# Patient Record
Sex: Female | Born: 1965 | Race: White | Hispanic: No | Marital: Single | Smoking: Former smoker
Health system: Southern US, Community
[De-identification: ages and names within clinical notes are randomized; demographics above are authoritative.]

## PROBLEM LIST (undated history)

## (undated) DIAGNOSIS — F329 Major depressive disorder, single episode, unspecified: Secondary | ICD-10-CM

## (undated) DIAGNOSIS — Z85810 Personal history of malignant neoplasm of tongue: Secondary | ICD-10-CM

## (undated) DIAGNOSIS — F419 Anxiety disorder, unspecified: Secondary | ICD-10-CM

## (undated) DIAGNOSIS — F32A Depression, unspecified: Secondary | ICD-10-CM

## (undated) HISTORY — DX: Major depressive disorder, single episode, unspecified: F32.9

## (undated) HISTORY — PX: NECK DISSECTION: SUR422

## (undated) HISTORY — DX: Anxiety disorder, unspecified: F41.9

## (undated) HISTORY — DX: Personal history of malignant neoplasm of tongue: Z85.810

## (undated) HISTORY — DX: Depression, unspecified: F32.A

## (undated) HISTORY — PX: PARTIAL GLOSSECTOMY: SHX2173

---

## 1998-05-31 ENCOUNTER — Other Ambulatory Visit: Admission: RE | Admit: 1998-05-31 | Discharge: 1998-05-31 | Payer: Self-pay | Admitting: Gynecology

## 1999-05-31 ENCOUNTER — Other Ambulatory Visit: Admission: RE | Admit: 1999-05-31 | Discharge: 1999-05-31 | Payer: Self-pay | Admitting: Gynecology

## 2000-07-22 ENCOUNTER — Other Ambulatory Visit: Admission: RE | Admit: 2000-07-22 | Discharge: 2000-07-22 | Payer: Self-pay | Admitting: Gynecology

## 2001-07-22 ENCOUNTER — Other Ambulatory Visit: Admission: RE | Admit: 2001-07-22 | Discharge: 2001-07-22 | Payer: Self-pay | Admitting: Gynecology

## 2015-05-27 ENCOUNTER — Encounter (HOSPITAL_COMMUNITY): Payer: Self-pay | Admitting: Emergency Medicine

## 2015-05-27 ENCOUNTER — Ambulatory Visit (HOSPITAL_COMMUNITY)
Admission: EM | Admit: 2015-05-27 | Discharge: 2015-05-27 | Disposition: A | Discharge status: Home / Self Care | Payer: 59 | Attending: Family Medicine | Admitting: Family Medicine

## 2015-05-27 DIAGNOSIS — IMO0001 Reserved for inherently not codable concepts without codable children: Secondary | ICD-10-CM

## 2015-05-27 DIAGNOSIS — K140 Glossitis: Secondary | ICD-10-CM | POA: Diagnosis not present

## 2015-05-27 DIAGNOSIS — R030 Elevated blood-pressure reading, without diagnosis of hypertension: Secondary | ICD-10-CM

## 2015-05-27 DIAGNOSIS — R000 Tachycardia, unspecified: Secondary | ICD-10-CM

## 2015-05-27 MED ORDER — CLINDAMYCIN HCL 300 MG PO CAPS: 300.0000 mg | ORAL_CAPSULE | Freq: Four times a day (QID) | ORAL | Status: DC

## 2015-05-27 MED ORDER — PREDNISONE 20 MG PO TABS: 20.0000 mg | ORAL_TABLET | Freq: Every day | ORAL | Status: DC

## 2015-05-27 NOTE — ED Provider Notes (Signed)
CSN: [id number]     Arrival date & time 05/27/15  1851 History   First MD Initiated Contact with Patient 05/27/15 1957     Chief Complaint  Patient presents with  . Oral Swelling   (Consider location/radiation/quality/duration/timing/severity/associated sxs/prior Treatment) The history is provided by the patient. No language interpreter was used.  Tongue swelling: C/O swelling of her tongue on the left side which started few weeks ago gradually but worsening two to three days,she has diffuculty with eating and swallowing pills. 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 Denies SOB, no choking. Just some discomfort and pain over her tongue. Denies medication or food allergy. She has hx of tobacco smoking years ago. Elevated BP/HR: No BP or cardiac related complaints.  History reviewed. No pertinent past medical history. History reviewed. No pertinent past surgical history. History reviewed. No pertinent family history. Social History  Substance Use Topics  . Smoking status: Never Smoker   . Smokeless tobacco: None  . Alcohol Use: No   OB History    No data available     Review of Systems  HENT:       Tongue pain and swelling  Respiratory: Negative.   Cardiovascular: Negative.   All other systems reviewed and are negative.   Allergies  Review of patient's allergies indicates no known allergies.  Home Medications   Prior to Admission medications   Not on File   Meds Ordered and Administered this Visit  Medications - No data to display  BP 150/82 mmHg  Pulse 114  Temp(Src) 98.2 F (36.8 C) (Oral)  SpO2 100% No data found.   Physical Exam  Constitutional: She appears well-developed. No distress.  HENT:  Head: Normocephalic.  Mouth/Throat:    Cardiovascular: Normal rate, regular rhythm and normal heart sounds.   No murmur heard. Pulmonary/Chest: Effort normal and breath sounds normal. No respiratory distress.  She has no wheezes. She has no rales. She exhibits no tenderness.  Nursing note and vitals reviewed.     ED Course  Procedures (including critical care time)  Labs Review Labs Reviewed - No data to display  Imaging Review No results found.   Visual Acuity Review  Right Eye Distance:   Left Eye Distance:   Bilateral Distance:    Right Eye Near:   Left Eye Near:    Bilateral Near:         MDM  No diagnosis found. Tongue ulcer  Tongue infection  Tachycardia  Elevated blood pressure  Here tongue looks infected. This could be from biting. A/B prescribed with steroid to help with the swelling. I recommended ENT assessment to r/o cancer given smoking hx. She agreed to see her PCP as soon as possible for referral. In the mean time may use warm saline gurgle at home as well.  Her HR and BP are elevated although she is completely asymptomatic except for mild anxiety about her tongue. This could well contribute to her elevated BP and HR. Instruction given to f/u with her PCP to address issue.    Kinnie Feil, MD 05/27/15 2021

## 2015-05-27 NOTE — ED Notes (Signed)
The patient presented to the [facility] with a complaint of a swollen tongue that has been ongoing for 2 weeks.

## 2015-05-27 NOTE — Discharge Instructions (Signed)
It was nice seeing you today. I am sorry about your tongue pain. It could be from frequent biting. It does look infection. I will give some antibiotic. Also due to history of smoking please see ENT for further evaluation in the next week. Note your Heart Rate and BP are elevated. Please see your PCP as soon as possible for assessment.

## 2015-05-30 ENCOUNTER — Other Ambulatory Visit: Payer: Self-pay | Admitting: Otolaryngology

## 2015-05-30 ENCOUNTER — Ambulatory Visit
Admission: RE | Admit: 2015-05-30 | Discharge: 2015-05-30 | Disposition: A | Discharge status: Home / Self Care | Payer: 59 | Source: Ambulatory Visit | Attending: Otolaryngology | Admitting: Otolaryngology

## 2015-05-30 DIAGNOSIS — R220 Localized swelling, mass and lump, head: Principal | ICD-10-CM

## 2015-05-30 DIAGNOSIS — K148 Other diseases of tongue: Secondary | ICD-10-CM

## 2015-05-30 MED ORDER — IOPAMIDOL (ISOVUE-300) INJECTION 61%
75.0000 mL | Freq: Once | INTRAVENOUS | Status: AC | PRN
  Administered 2015-05-30: 75 mL via INTRAVENOUS

## 2015-06-09 ENCOUNTER — Other Ambulatory Visit (HOSPITAL_COMMUNITY): Payer: Self-pay | Admitting: Otolaryngology

## 2015-06-09 ENCOUNTER — Other Ambulatory Visit: Payer: Self-pay | Admitting: Otolaryngology

## 2015-06-09 DIAGNOSIS — C029 Malignant neoplasm of tongue, unspecified: Secondary | ICD-10-CM

## 2015-06-09 DIAGNOSIS — R590 Localized enlarged lymph nodes: Secondary | ICD-10-CM

## 2015-06-16 ENCOUNTER — Encounter (HOSPITAL_COMMUNITY): Payer: 59

## 2017-11-29 IMAGING — CT CT NECK W/ CM
2 of 3 series · 8 of 14 positions shown, 9 images · IV contrast (iopamidol)
Comparison: None.

CLINICAL DATA: 49-year-old female with tongue mass discovered 3
weeks ago, biopsy planned. Initial encounter.

EXAM:
CT NECK WITH CONTRAST
TECHNIQUE: Multidetector CT imaging of the neck was performed using the
standard protocol following the bolus administration of intravenous
contrast.
CONTRAST:  75mL W5XG1E-LPP IOPAMIDOL (W5XG1E-LPP) INJECTION 61%

[Series 2: neck · axial · 0.38mm/px · z∈[-293,-122]mm · 4 of 97 slices shown]
[im 20/97  bone]
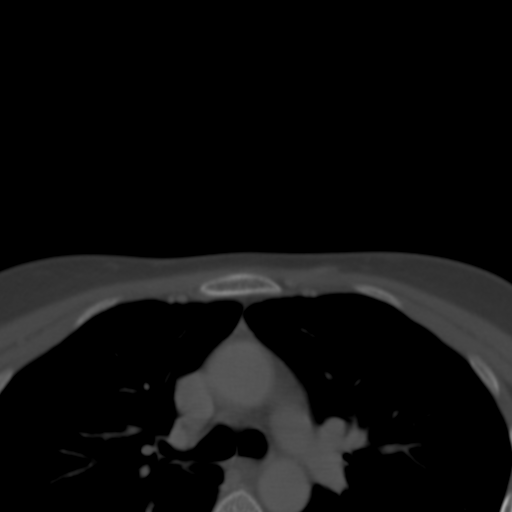
[im 39/97  bone]
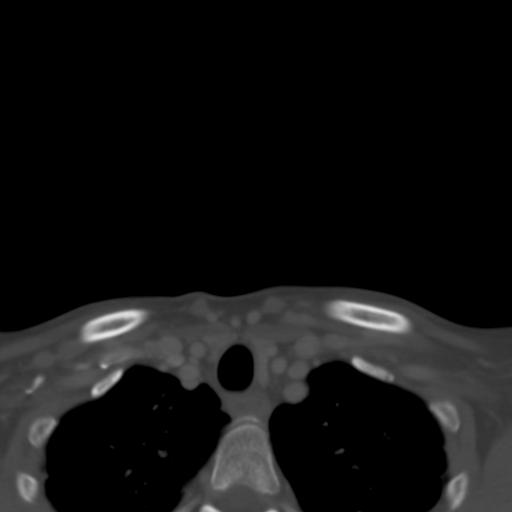
[im 58/97  bone]
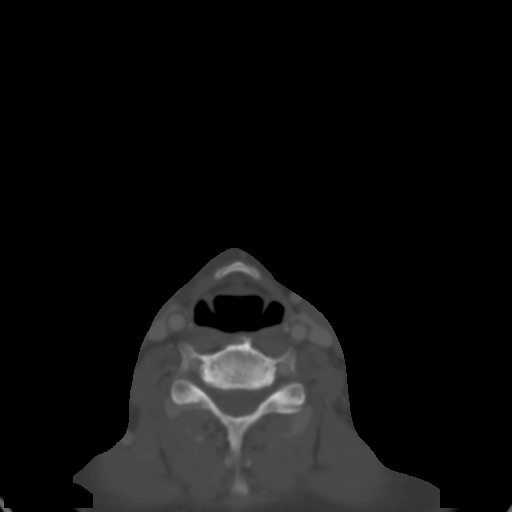
[im 77/97  bone]
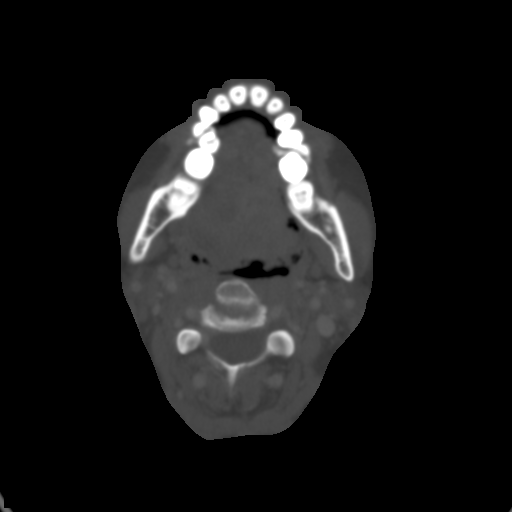

[Series 6: angled axial neck · axial · 0.39mm/px · z∈[-319,-150]mm · 4 of 98 slices shown, 5 images]
[im 20/98  soft-tissue]
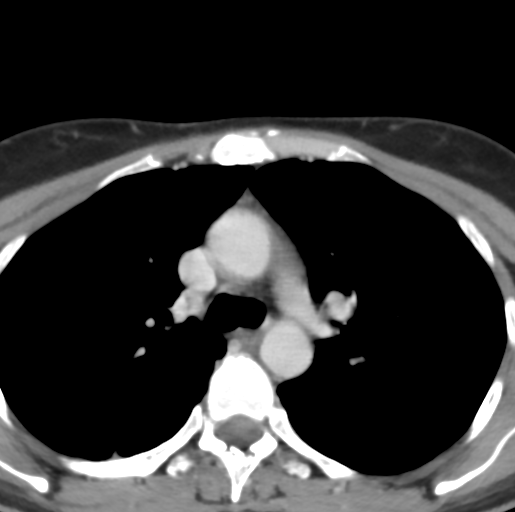
[im 20/98  bone]
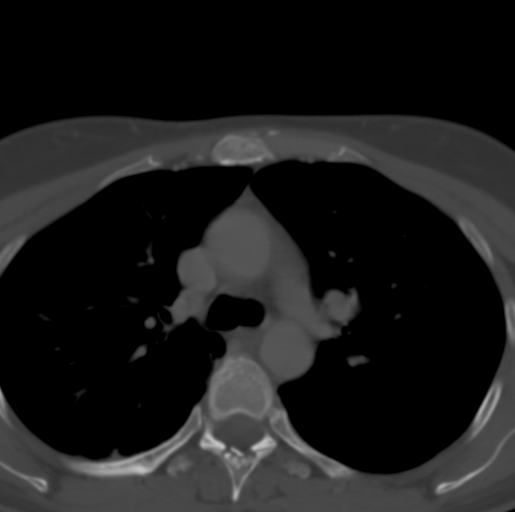
[im 39/98  bone]
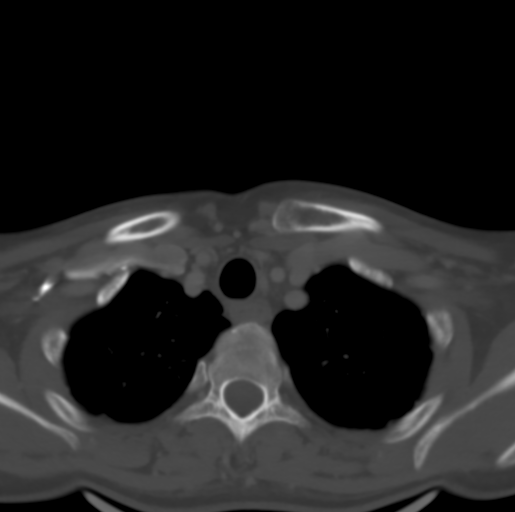
[im 59/98  bone]
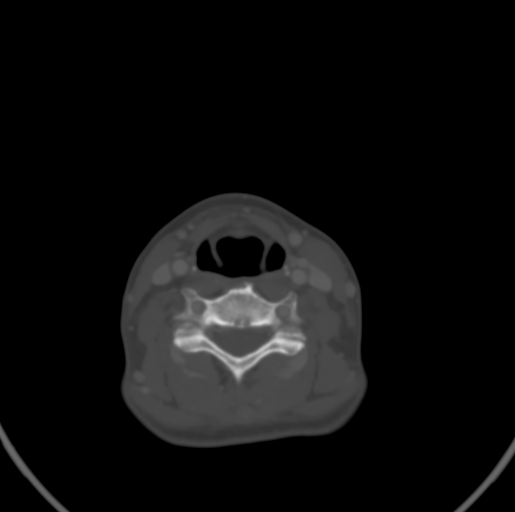
[im 78/98  bone]
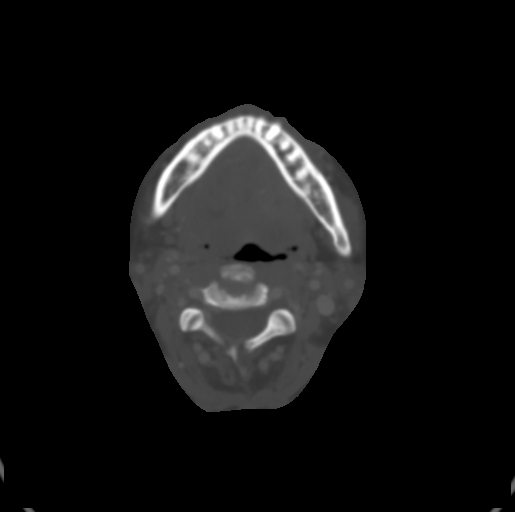

[8 of 14 positions shown; findings below may reference images not displayed]

FINDINGS: Pharynx and larynx: Negative larynx. Normal hypopharynx and
vallecula.

Large area of heterogeneous density within the oral tongue right of
midline, tracking toward the tongue base with mass effect. Central
cystic change or necrosis with peripheral and nodular internal
enhancement. See series 2, image 17, sagittal image 34 and coronal
image 26. The lesion encompasses 23 x 26 x 27 mm (AP by transverse
by CC). The mass does not appear to extend to the mylohyoid muscle
level or sublingual space.

There is subsequent mild effacement of the oropharynx. Palatine
tonsils probably are surgically absent. Negative nasopharynx.
Negative parapharyngeal and retropharyngeal spaces.

Salivary glands: Submandibular and parotid glands are within normal
limits.

Thyroid: Negative; several subcentimeter hypodense nodules which do
not meet consensus criteria for ultrasound follow-up.

Lymph nodes: Posterior to the left submandibular gland there is a
left level IIa node measuring 10 mL short axis which is
asymmetrically enlarged. Right level 2 nodes measure up to 6 mm. No
level 1 lymphadenopathy. Mildly asymmetric but within normal limits
left level 4 node measuring 6 mm short axis. Other bilateral
cervical nodes appear within normal limits.

Vascular: Major vascular structures in the neck and at the skullbase
are normal.

Limited intracranial: Negative.

Visualized orbits: Negative.

Mastoids and visualized paranasal sinuses: Clear.

Skeleton: Chronic disc and endplate degeneration in the lower
cervical spine. No acute or suspicious osseous lesion.

Upper chest: No superior mediastinal or axillary lymphadenopathy.
Negative lung apices.
IMPRESSION: 1. Relatively large heterogeneously enhancing mass involving the
posterior oral tongue tracking toward the tongue base, epicenter
right of midline. Estimated lesion size 23 x 26 x 27 mm. Favor
carcinoma.
2. No level 1 lymphadenopathy. There is a suspicious but
indeterminate left level IIa lymph node measuring 10 mm short axis
(sagittal image 46).

## 2017-12-18 ENCOUNTER — Encounter: Payer: Self-pay | Admitting: Family Medicine

## 2017-12-18 ENCOUNTER — Ambulatory Visit: Payer: 59 | Admitting: Family Medicine

## 2017-12-18 VITALS — BP 110/70 | HR 64 | Ht 63.0 in | Wt 99.0 lb

## 2017-12-18 DIAGNOSIS — Z7689 Persons encountering health services in other specified circumstances: Secondary | ICD-10-CM

## 2017-12-18 DIAGNOSIS — F32A Depression, unspecified: Secondary | ICD-10-CM

## 2017-12-18 DIAGNOSIS — G8929 Other chronic pain: Secondary | ICD-10-CM | POA: Diagnosis not present

## 2017-12-18 DIAGNOSIS — F329 Major depressive disorder, single episode, unspecified: Secondary | ICD-10-CM

## 2017-12-18 DIAGNOSIS — F419 Anxiety disorder, unspecified: Secondary | ICD-10-CM

## 2017-12-18 DIAGNOSIS — Z85810 Personal history of malignant neoplasm of tongue: Secondary | ICD-10-CM

## 2017-12-18 NOTE — Progress Notes (Signed)
Subjective:    Patient ID: Alexis Alvarez, female    DOB: 12-22-1965, 52 y.o.   MRN: [id number]  HPI Chief Complaint  Patient presents with  . Establish Care    get established. Has been being seen at cancer center @ [facility] amd is going to be going less so she will need you to take over some of her refills.   . Flu Vaccine    declined.   She is new to the practice and here to establish care.  She is also here to discuss renewal of oxycodone and Ativan for chronic pain related to tongue cancer and Ativan for anxiety.  She was initially prescribed these medications by her oncologist and states she was recently told that she would need to find another provider to continue prescribing these medications for her. 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  History of multiple surgeries. Currently has a G-tube and is still unable to eat  States it has been 2 1/2 years since being diagnosed. 2 years out from treatment.   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  She is getting physical therapy for this. She also wears a compression garment at times.   Denies fever, chills, dizziness, chest pain, palpitations, shortness of breath, abdominal pain, N/V/D, urinary symptoms, LE edema.    Reports anxiety related to health conditions over the past couple of years and she was started on Ativan which she does take every evening to help her sleep. Has an upcoming appointment with Linzie Collin next Tuesday.   History of IBS.  Denies having any issues with this since having a feeding tube.  Lives alone Divorced. No kids. Works as a Development worker, international [employer].  States she is an only child   Smoked for 5 years and stopped in 1996. History of minimal alcohol use.   Reviewed allergies, medications, past medical, surgical, family, and social  history.   Review of Systems Pertinent positives and negatives in the history of present illness.     Objective:   Physical Exam BP 110/70   Pulse 64   Ht 5\' 3"  (1.6 m)   Wt 99 lb (44.9 kg)   BMI 17.54 kg/m   Alert and oriented and in no acute distress. Not otherwise examined. No SI or HI.       Assessment & Plan:  Anxiety and depression  History of tongue cancer - Plan: Ambulatory referral to Pain Clinic  Encounter to establish care  Other chronic pain - Plan: Ambulatory referral to Pain Clinic  Encouraged her to see her counselor next week and to discuss anxiety and depression. She has been taking Ativan for the past couple of years and now feels like she needs it to sleep. Discussed that this medication is not meant for long term use and has potential side effects when taken long term.  Consider Cymbalta or other medication in the future and wean her off Ativan.  Referral to pain management due to daily Oxycodone use since tongue cancer treatment.  Review of oncologist note from March 2019 states pain management goal was to wean her down from Oxycodone 5 mg every 4 hours. In June 2019 she was weaning down to 1-2 doses per day and discussed starting on gabapentin at bedtime. Will need to discuss weaning off the oxycodone entirely and consider alternative treatment.  Follow  up as needed.

## 2017-12-19 ENCOUNTER — Encounter: Payer: Self-pay | Admitting: Family Medicine

## 2017-12-23 ENCOUNTER — Other Ambulatory Visit: Payer: Self-pay

## 2017-12-23 NOTE — Telephone Encounter (Signed)
Patient stated she is taking both medications. She needs a refill on the Ativan that she takes generally once a day in the evenings. Please advise further

## 2017-12-23 NOTE — Telephone Encounter (Signed)
Patient has called to request a refill on Ativan. Please advise

## 2017-12-23 NOTE — Telephone Encounter (Signed)
Please call her and find out how often she is taking the Ativan and Oxycodone.

## 2017-12-24 MED ORDER — LORAZEPAM 1 MG PO TABS: ORAL_TABLET | ORAL | 0 refills | Status: AC

## 2017-12-24 NOTE — Telephone Encounter (Signed)
Please let her know that I am refilling this with the goal of weaning her off. We can discuss starting a long term medication for her anxiety but as we discussed, staying on Ativan long term is not recommended. Have her cut back by trying 1/2 tablet daily for a couple of weeks and then even working her way to a quarter tablet. I expect that she will not need another refill in 30 days if at all. Ask if she has seen her counselor yet and if she advised a different plan for her anxiety?

## 2017-12-24 NOTE — Telephone Encounter (Signed)
Pt called to check on status of refill request from yesterday

## 2017-12-24 NOTE — Telephone Encounter (Signed)
Pt states that she did see her couselor and she is recommending ativan for her aniexty. Please advise. I can not refill, you will have too.

## 2018-03-12 ENCOUNTER — Encounter: Payer: Self-pay | Admitting: Internal Medicine

## 2021-08-10 ENCOUNTER — Encounter: Payer: Self-pay | Admitting: Internal Medicine

## 2021-12-25 ENCOUNTER — Encounter: Payer: Self-pay | Admitting: Internal Medicine
